# Patient Record
Sex: Female | Born: 1995 | Race: White | Hispanic: No | Marital: Single | Smoking: Never smoker
Health system: Southern US, Community
[De-identification: ages and names within clinical notes are randomized; demographics above are authoritative.]

## PROBLEM LIST (undated history)

## (undated) ENCOUNTER — Emergency Department: Discharge status: Absent without leave | Payer: Managed Care, Other (non HMO)

## (undated) DIAGNOSIS — I471 Supraventricular tachycardia: Secondary | ICD-10-CM

## (undated) DIAGNOSIS — G43909 Migraine, unspecified, not intractable, without status migrainosus: Secondary | ICD-10-CM

## (undated) HISTORY — PX: FASCIOTOMY: SHX132

## (undated) HISTORY — PX: ADENOIDECTOMY: SUR15

## (undated) HISTORY — PX: SHOULDER SURGERY: SHX246

---
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
## 2015-02-18 ENCOUNTER — Emergency Department
Admission: EM | Admit: 2015-02-18 | Discharge: 2015-02-19 | Disposition: A | Discharge status: Home / Self Care | Payer: BLUE CROSS/BLUE SHIELD | Attending: Emergency Medicine | Admitting: Emergency Medicine

## 2015-02-18 ENCOUNTER — Encounter: Payer: Self-pay | Admitting: Emergency Medicine

## 2015-02-18 ENCOUNTER — Emergency Department: Payer: BLUE CROSS/BLUE SHIELD

## 2015-02-18 DIAGNOSIS — Z8679 Personal history of other diseases of the circulatory system: Secondary | ICD-10-CM

## 2015-02-18 DIAGNOSIS — R000 Tachycardia, unspecified: Secondary | ICD-10-CM | POA: Diagnosis present

## 2015-02-18 DIAGNOSIS — I471 Supraventricular tachycardia: Secondary | ICD-10-CM | POA: Diagnosis not present

## 2015-02-18 LAB — CBC
HCT: 41.6 % (ref 35.0–47.0)
HEMOGLOBIN: 14.4 g/dL (ref 12.0–16.0)
MCH: 29.5 pg (ref 26.0–34.0)
MCHC: 34.6 g/dL (ref 32.0–36.0)
MCV: 85.3 fL (ref 80.0–100.0)
PLATELETS: 241 10*3/uL (ref 150–440)
RBC: 4.87 MIL/uL (ref 3.80–5.20)
RDW: 12.6 % (ref 11.5–14.5)
WBC: 4.7 10*3/uL (ref 3.6–11.0)

## 2015-02-18 LAB — BASIC METABOLIC PANEL
ANION GAP: 6 (ref 5–15)
BUN: 9 mg/dL (ref 6–20)
CALCIUM: 10.1 mg/dL (ref 8.9–10.3)
CO2: 29 mmol/L (ref 22–32)
CREATININE: 0.76 mg/dL (ref 0.44–1.00)
Chloride: 105 mmol/L (ref 101–111)
Glucose, Bld: 91 mg/dL (ref 65–99)
Potassium: 3.8 mmol/L (ref 3.5–5.1)
SODIUM: 140 mmol/L (ref 135–145)

## 2015-02-18 LAB — TSH: TSH: 2.35 u[IU]/mL (ref 0.350–4.500)

## 2015-02-18 LAB — TROPONIN I

## 2015-02-18 NOTE — ED Notes (Addendum)
Patient ambulatory to triage with steady gait, without difficulty or distress noted; pt reports sensation of heart beating fast PTA; st hx of same 3wks ago and received adenosine but denies hx prior to that; pt st sensation accomp by chest pressure

## 2015-02-18 NOTE — ED Notes (Signed)
Lab notified of add on TSH level
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
## 2015-02-22 ENCOUNTER — Encounter
Admission: EM | Disposition: A | Discharge status: Other Acute Inpatient Hospital | Payer: Self-pay | Source: Home / Self Care | Attending: Student

## 2015-02-22 ENCOUNTER — Emergency Department
Admission: EM | Admit: 2015-02-22 | Discharge: 2015-02-22 | Payer: BLUE CROSS/BLUE SHIELD | Attending: Student | Admitting: Student

## 2015-02-22 ENCOUNTER — Encounter: Payer: Self-pay | Admitting: *Deleted

## 2015-02-22 DIAGNOSIS — R000 Tachycardia, unspecified: Secondary | ICD-10-CM | POA: Diagnosis not present

## 2015-02-22 DIAGNOSIS — Z79899 Other long term (current) drug therapy: Secondary | ICD-10-CM | POA: Insufficient documentation

## 2015-02-22 DIAGNOSIS — M79662 Pain in left lower leg: Secondary | ICD-10-CM | POA: Insufficient documentation

## 2015-02-22 DIAGNOSIS — G8918 Other acute postprocedural pain: Secondary | ICD-10-CM | POA: Diagnosis not present

## 2015-02-22 DIAGNOSIS — M79605 Pain in left leg: Secondary | ICD-10-CM

## 2015-02-22 LAB — CBC WITH DIFFERENTIAL/PLATELET
BASOS ABS: 0 10*3/uL (ref 0–0.1)
Basophils Relative: 0 %
Eosinophils Absolute: 0 10*3/uL (ref 0–0.7)
Eosinophils Relative: 1 %
HEMATOCRIT: 39.9 % (ref 35.0–47.0)
Hemoglobin: 13.8 g/dL (ref 12.0–16.0)
LYMPHS PCT: 51 %
Lymphs Abs: 4.1 10*3/uL — ABNORMAL HIGH (ref 1.0–3.6)
MCH: 29.7 pg (ref 26.0–34.0)
MCHC: 34.6 g/dL (ref 32.0–36.0)
MCV: 86 fL (ref 80.0–100.0)
MONO ABS: 0.5 10*3/uL (ref 0.2–0.9)
MONOS PCT: 6 %
NEUTROS ABS: 3.4 10*3/uL (ref 1.4–6.5)
Neutrophils Relative %: 42 %
Platelets: 238 10*3/uL (ref 150–440)
RBC: 4.64 MIL/uL (ref 3.80–5.20)
RDW: 12.7 % (ref 11.5–14.5)
WBC: 8 10*3/uL (ref 3.6–11.0)

## 2015-02-22 LAB — PROTIME-INR
INR: 0.92
Prothrombin Time: 12.6 seconds (ref 11.4–15.0)

## 2015-02-22 LAB — BASIC METABOLIC PANEL
ANION GAP: 9 (ref 5–15)
BUN: 8 mg/dL (ref 6–20)
CALCIUM: 9.1 mg/dL (ref 8.9–10.3)
CO2: 24 mmol/L (ref 22–32)
Chloride: 106 mmol/L (ref 101–111)
Creatinine, Ser: 0.84 mg/dL (ref 0.44–1.00)
GFR calc Af Amer: >60 mL/min (ref >60)
GFR calc non Af Amer: >60 mL/min (ref >60)
GLUCOSE: 90 mg/dL (ref 65–99)
Potassium: 3.6 mmol/L (ref 3.5–5.1)
Sodium: 139 mmol/L (ref 135–145)

## 2015-02-22 LAB — APTT: APTT: 26 s (ref 24–36)

## 2015-02-22 SURGERY — FASCIOTOMY, UPPER EXTREMITY
Anesthesia: Choice | Laterality: Left

## 2015-02-22 MED ORDER — MORPHINE SULFATE (PF) 4 MG/ML IV SOLN
4.0000 mg | Freq: Once | INTRAVENOUS | Status: AC
  Administered 2015-02-22: 4 mg via INTRAVENOUS

## 2015-02-22 MED ORDER — MORPHINE SULFATE (PF) 4 MG/ML IV SOLN
4.0000 mg | Freq: Once | INTRAVENOUS | Status: AC
  Administered 2015-02-22: 4 mg via INTRAVENOUS
  Filled 2015-02-22: qty 1

## 2015-02-22 MED ORDER — LIDOCAINE HCL (PF) 1 % IJ SOLN
5.0000 mL | Freq: Once | INTRAMUSCULAR | Status: AC
  Administered 2015-02-22: 5 mL via INTRADERMAL

## 2015-02-22 MED ORDER — LIDOCAINE HCL (PF) 1 % IJ SOLN
INTRAMUSCULAR | Status: AC
  Administered 2015-02-22: 5 mL via INTRADERMAL
  Filled 2015-02-22: qty 5

## 2015-02-22 MED ORDER — ONDANSETRON HCL 4 MG/2ML IJ SOLN
4.0000 mg | Freq: Once | INTRAMUSCULAR | Status: AC
  Administered 2015-02-22: 4 mg via INTRAVENOUS

## 2015-02-22 MED ORDER — ONDANSETRON HCL 4 MG/2ML IJ SOLN
INTRAMUSCULAR | Status: AC
  Administered 2015-02-22: 4 mg via INTRAVENOUS
  Filled 2015-02-22: qty 2

## 2015-02-22 MED ORDER — ONDANSETRON HCL 4 MG/2ML IJ SOLN
4.0000 mg | Freq: Once | INTRAMUSCULAR | Status: AC
  Administered 2015-02-22: 4 mg via INTRAVENOUS
  Filled 2015-02-22: qty 2

## 2015-02-22 MED ORDER — MORPHINE SULFATE (PF) 4 MG/ML IV SOLN
INTRAVENOUS | Status: AC
  Administered 2015-02-22: 4 mg via INTRAVENOUS
  Filled 2015-02-22: qty 1

## 2015-02-22 NOTE — ED Provider Notes (Signed)
[facility] Emergency Department Provider Note  ____________________________________________  Time seen: Approximately 7:41 PM  I have reviewed the triage vital signs and the nursing notes.   HISTORY  Chief Complaint Leg Pain and Post-op Problem   HPI Deanna Little is a 19 y.o. female on postoperative day one status post fasciotomies of the left lower extremity for treatment of chronic compartment syndrome who presents for evaluation of gradual onset severe aching left leg pain today, currently severe, worse with movement and palpation. She was performed at [facility]. She has been taking her medications as prescribed but has had worsening severe pain. She called the orthopedic resident at [facility] who recommended she be seen at the nearest emergency department as soon as possible. Currently her pain is 10 out of 10. No chest pain, no difficulty breathing, no vomiting or diarrhea.    History reviewed. No pertinent past medical history.  There are no active problems to display for this patient.   Past Surgical History  Procedure Laterality Date  . Adenoidectomy    . Shoulder surgery      Current Outpatient Rx  Name  Route  Sig  Dispense  Refill  . amphetamine-dextroamphetamine (ADDERALL) 12.5 MG tablet   Oral   Take 12.5 mg by mouth daily.         . ondansetron (ZOFRAN-ODT) 4 MG disintegrating tablet   Oral   Take 4 mg by mouth every 8 (eight) hours as needed for nausea or vomiting.         . OxyCODONE HCl, Abuse Deter, (OXAYDO) 5 MG TABA   Oral   Take by mouth every 4 (four) hours.         . pregabalin (LYRICA) 75 MG capsule   Oral   Take 75 mg by mouth 2 (two) times daily.           Allergies Review of patient's allergies indicates no known allergies.  No family history on file.  Social History Social History  Substance Use Topics  . Smoking status: Never Smoker   . Smokeless tobacco: None  . Alcohol Use: No    Review of  Systems Constitutional: No fever/chills Eyes: No visual changes. ENT: No sore throat. Cardiovascular: Denies chest pain. Respiratory: Denies shortness of breath. Gastrointestinal: No abdominal pain.  No nausea, no vomiting.  No diarrhea.  No constipation. Genitourinary: Negative for dysuria. Musculoskeletal: Negative for back pain. Skin: Negative for rash. Neurological: Negative for headaches, focal weakness or numbness.  10-point ROS otherwise negative.  ____________________________________________   PHYSICAL EXAM:  VITAL SIGNS: ED Triage Vitals  Enc Vitals Group     BP 02/22/15 1936 132/88 mmHg     Pulse Rate 02/22/15 1936 100     Resp 02/22/15 1936 22     Temp --      Temp src --      SpO2 02/22/15 1936 100 %     Weight --      Height --      Head Cir --      Peak Flow --      Pain Score 02/22/15 1907 10     Pain Loc --      Pain Edu? --      Excl. in GC? --     Constitutional: Alert and oriented. In severe distress second to pain. Eyes: Conjunctivae are normal. PERRL. EOMI. Head: Atraumatic. Nose: No congestion/rhinnorhea. Mouth/Throat: Mucous membranes are moist.  Oropharynx non-erythematous. Neck: No stridor.   Cardiovascular:  mildly tachycardic rate, regular rhythm. Grossly normal heart sounds.  Good peripheral circulation. Respiratory: Normal respiratory effort.  No retractions. Lungs CTAB. Gastrointestinal: Soft and nontender. No distention. No abdominal bruits. No CVA tenderness. Genitourinary: deferred Musculoskeletal: There are 4 surgical incisions in the anterior portion of the left lower extremity which are covered with Steri-Strips, there is no surrounding erythema, induration, no purulent drainage. The patient does have tenderness throughout the left calf. She has a 2+ left DP pulse. She is able to wiggle the toes but only slightly and is complaining of decreased sensation in the dorsum of the foot. Neurologic:  Normal speech and language. No gross  focal neurologic deficits are appreciated. No gait instability. Skin:  Skin is warm, dry and intact. No rash noted. Psychiatric: Mood and affect are normal. Speech and behavior are normal.  ____________________________________________   LABS (all labs ordered are listed, but only abnormal results are displayed)  Labs Reviewed  CBC WITH DIFFERENTIAL/PLATELET - Abnormal; Notable for the following:    Lymphs Abs 4.1 (*)    All other components within normal limits  BASIC METABOLIC PANEL  PROTIME-INR  APTT   ____________________________________________  EKG  none ____________________________________________  RADIOLOGY  none ____________________________________________   PROCEDURES  Procedure(s) performed: None  Critical Care performed: No  ____________________________________________   INITIAL IMPRESSION / ASSESSMENT AND PLAN / ED COURSE  Pertinent labs & imaging results that were available during my care of the patient were reviewed by me and considered in my medical decision making (see chart for details).  Deanna Little is a 19 y.o. female on postoperative day one status post fasciotomies of the left lower extremity for treatment of chronic compartment syndrome who presents for evaluation of gradual onset severe aching left leg pain today. On exam, she is in distress secondary to pain. She does have tenderness throughout the left calf but hasn't 2+ DP pulse. She is able to wiggle the toes but only slightly. I discussed the case with the on-call surgeon at [facility], Dr.Garrigues, given that her operation was performed there. I have described her exam and he agrees there is concern for evolving symptoms of compartment syndrome, he has recommended the patient be seen emergently by our orthopedist. I discussed the case with Dr. Hyacinth Meeker of orthopedic surgery who will evaluate the patient. We'll treat her pain.  ----------------------------------------- 9:49 PM on  02/22/2015 -----------------------------------------  Dr. Hyacinth Meeker has evaluated the patient. He reports that on his assessment injured compartmental pressures are 17 - not consistent with acute compartment syndrome. Patient requested transfer to [facility]. We had an extensive conversation with family and this is their request and I discussed the case with Dr.Garrigues of [facility] who will accept transfer. Dr. Hyacinth Meeker has also had extensive conversations with the family as well as Dr. Joanette Gula and all are comfortable with the plan. ____________________________________________   FINAL CLINICAL IMPRESSION(S) / ED DIAGNOSES  Final diagnoses:  Leg pain, diffuse, left  Acute post-operative pain      Gayla Doss, MD 02/23/15 0023

## 2015-02-22 NOTE — Consult Note (Signed)
ORTHOPAEDIC CONSULTATION  REQUESTING PHYSICIAN: Gayla Doss, MD  Chief Complaint: Left leg pain  HPI: Deanna Little is a 19 y.o. female who complains of  increasing left leg pain today.  Yesterday Dr. Austin Miles at [facility], performed a limited incision 4 compartment decompression fasciotomy of the lower left leg for exertional compartment syndrome.  The patient had a nerve block performed as well.  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  She was prescribed oxycodone and Lyrica.  During the day she had the gradual increase of pain in the anterolateral aspect of the leg.  She's had some numbness on the top of the foot.  She also feels that she's had some weakness of the ankle and great toe.  Her mother and father are here with her for the weekend and called [facility] during the day but were not advised to bring her there at that time.  Because of increasing discomfort they presented to the [facility] emergency room.  I was called by Dr. Toney Rakes at about 8:30 PM and came to the emergency room directly.  Upon arrival, I found the patient to be in mild distress.  I removed the dressings on her leg and examine the leg carefully.  The tissues to be soft without evidence of hematoma or undue swelling.  She had some mild sensory deficit on the dorsum of the foot only.  She also had some weakness of the great toe and ankle at about 3-4 over 5.  Her dressings were clean and dry.  I explained to the patient and her parents that if she was indeed having an acute compartment syndrome episode that this was a surgical emergency and I recommended that we perform compartment pressure measurements.  Her parents felt very strongly that they wanted her daughter to be taken care of at [facility] since this was where she had her original surgery and requested that I contact the orthopedic surgeons at [facility] to discuss this  further with them.  I spoke to Dr. Iline Oven who spoke to Dr. Carolynne Edouard and then called back and again recommended that we proceed with compartment pressure measurements.  He spoke to the patient's mother directly and she agreed to this plan of treatment.  Using the [employer] compartment pressure measuring system the anterior compartment measured 17 mmHg pressure.  Dr. Iline Oven had agreed to take the patient in transfer if the pressures were below 30 mmHg.  We contacted Dr. Iline Oven again and he agreed to take the patient in transfer for observation and further evaluation.  Based on the pressure measurements, it did not appear that there was an acute compartment episode occurring.  The parents were at the bedside and informed of everything as it happened.  At the request arrangements were made for the patient to be transferred by EMS directly to [facility] emergency room for evaluation and admission.  History reviewed. No pertinent past medical history. Past Surgical History  Procedure Laterality Date  . Adenoidectomy    . Shoulder surgery     Social History   Social History  . Marital Status: Single    Spouse Name: N/A  . Number of Children: N/A  . Years of Education: N/A   Social History Main Topics  . Smoking status: Never Smoker   . Smokeless tobacco: None  . Alcohol Use: No  . Drug Use: None  . Sexual Activity:  Not Asked   Other Topics Concern  . None   Social History Narrative   No family history on file. No Known Allergies Prior to Admission medications   Medication Sig Start Date End Date Taking? Authorizing Provider  amphetamine-dextroamphetamine (ADDERALL) 12.5 MG tablet Take 12.5 mg by mouth daily.   Yes Historical Provider, MD  doxycycline (VIBRAMYCIN) 100 MG capsule Take 1 capsule by mouth 2 (two) times daily.   Yes Historical Provider, MD  drospirenone-ethinyl estradiol (LORYNA) 3-0.02 MG tablet Take 1 tablet by mouth daily. 11/28/14  Yes Historical Provider, MD  metoprolol  succinate (TOPROL-XL) 25 MG 24 hr tablet Take 1 tablet by mouth daily. 02/19/15 02/19/16 Yes Historical Provider, MD  ondansetron (ZOFRAN-ODT) 4 MG disintegrating tablet Take 4 mg by mouth every 8 (eight) hours as needed for nausea or vomiting.   Yes Historical Provider, MD  OxyCODONE HCl, Abuse Deter, (OXAYDO) 5 MG TABA Take by mouth every 4 (four) hours.   Yes Historical Provider, MD  pregabalin (LYRICA) 75 MG capsule Take 75 mg by mouth 2 (two) times daily.   Yes Historical Provider, MD   No results found.  Positive ROS: All other systems have been reviewed and were otherwise negative with the exception of those mentioned in the HPI and as above.  Physical Exam: General: Alert, no acute distress Cardiovascular: No pedal edema Respiratory: No cyanosis, no use of accessory musculature GI: No organomegaly, abdomen is soft and non-tender Skin: No lesions in the area of chief complaint Neurologic: Sensation intact distally except for left lower leg. Psychiatric: Patient is competent for consent with some distress and anxiety noted.   Lymphatic: No axillary or cervical lymphadenopathy  MUSCULOSKELETAL: The left lower leg was examined carefully with dressings removed.  Her dressings were clean and dry with no drainage.  There was no redness, warmth or evidence of cellulitis.  The anterior, lateral, and posterior compartments were soft to touch.  There was no ballottement.  She had some mild sensory deficit over the dorsum of the foot.  There was no sensory loss of the posterior calf or lateral aspect of the foot.  The  anterior tibialis and extensor hallucis longus muscles function exhibited weakness in the 3-4 over 5 category.  She had intact plantar flexion strength.  She had 2+ anterior and posterior tibial pulses.  The knee and upper leg were unremarkable to exam.  The right lower extremity was unremarkable to exam, as were the upper extremities.  Assessment: Left leg pain and numbness/weakness  following complete fasciotomies yesterday.  The tissues are soft and the compartment pressure testing is normal.  She has not been splinted, and the foot has been in a plantarflexed position all day.  This may have caused some of her problems.  Plan: At the parents and patient's request the patient is being transferred to [facility] orthopedic service for further evaluation.  I advised the patient and parents that further surgery may be indicated depending on their findings and any progression of symptoms.  They may call me with any questions or concerns and were given my card and phone numbers for future reference.    Valinda Hoar, MD [phone]   02/22/2015 10:11 PM

## 2015-02-22 NOTE — ED Notes (Signed)
OR RN Merry Proud reports OR ready for procedure. Page if necessary.

## 2015-02-22 NOTE — ED Notes (Signed)
MD Hyacinth Meeker at bedside, alongside OR RN Brandi.

## 2015-02-22 NOTE — ED Notes (Signed)
Pt reports surgery yesterday to relieve compartment syndrome. Pt has cast in place, but crying in pain. Surgery done at [facility]. Pt taking oxycodone, called surgeon, taking lyrica but still not helping.

## 2015-02-22 NOTE — ED Notes (Signed)
Surgeon at bedside.  

## 2015-02-22 NOTE — ED Notes (Signed)
Hand-off report given to Consulting civil engineer at [facility] ED.

## 2015-02-22 NOTE — ED Notes (Signed)
Hand-off report given to [employer].

## 2015-11-15 ENCOUNTER — Other Ambulatory Visit: Payer: Self-pay | Admitting: Family Medicine

## 2015-11-15 DIAGNOSIS — R1011 Right upper quadrant pain: Secondary | ICD-10-CM

## 2015-11-15 DIAGNOSIS — K582 Mixed irritable bowel syndrome: Secondary | ICD-10-CM

## 2015-11-19 ENCOUNTER — Ambulatory Visit
Admission: RE | Admit: 2015-11-19 | Discharge: 2015-11-19 | Disposition: A | Discharge status: Home / Self Care | Payer: BLUE CROSS/BLUE SHIELD | Source: Ambulatory Visit | Attending: Family Medicine | Admitting: Family Medicine

## 2015-11-19 DIAGNOSIS — R1011 Right upper quadrant pain: Secondary | ICD-10-CM | POA: Insufficient documentation

## 2015-11-19 DIAGNOSIS — K582 Mixed irritable bowel syndrome: Secondary | ICD-10-CM | POA: Insufficient documentation

## 2016-03-12 ENCOUNTER — Emergency Department
Admission: EM | Admit: 2016-03-12 | Discharge: 2016-03-12 | Disposition: A | Discharge status: Home / Self Care | Payer: No Typology Code available for payment source | Attending: Emergency Medicine | Admitting: Emergency Medicine

## 2016-03-12 ENCOUNTER — Encounter: Payer: Self-pay | Admitting: Emergency Medicine

## 2016-03-12 ENCOUNTER — Emergency Department: Payer: No Typology Code available for payment source

## 2016-03-12 DIAGNOSIS — Y92410 Unspecified street and highway as the place of occurrence of the external cause: Secondary | ICD-10-CM | POA: Insufficient documentation

## 2016-03-12 DIAGNOSIS — S161XXA Strain of muscle, fascia and tendon at neck level, initial encounter: Secondary | ICD-10-CM | POA: Insufficient documentation

## 2016-03-12 DIAGNOSIS — S199XXA Unspecified injury of neck, initial encounter: Secondary | ICD-10-CM | POA: Diagnosis present

## 2016-03-12 DIAGNOSIS — Z79899 Other long term (current) drug therapy: Secondary | ICD-10-CM | POA: Diagnosis not present

## 2016-03-12 DIAGNOSIS — Y9389 Activity, other specified: Secondary | ICD-10-CM | POA: Diagnosis not present

## 2016-03-12 DIAGNOSIS — M7918 Myalgia, other site: Secondary | ICD-10-CM

## 2016-03-12 DIAGNOSIS — Y999 Unspecified external cause status: Secondary | ICD-10-CM | POA: Insufficient documentation

## 2016-03-12 MED ORDER — CYCLOBENZAPRINE HCL 10 MG PO TABS: 10.0000 mg | ORAL_TABLET | Freq: Three times a day (TID) | ORAL | 0 refills | Status: AC | PRN

## 2016-03-12 MED ORDER — NAPROXEN 500 MG PO TABS
500.0000 mg | ORAL_TABLET | Freq: Once | ORAL | Status: AC
  Administered 2016-03-12: 500 mg via ORAL
  Filled 2016-03-12: qty 1

## 2016-03-12 MED ORDER — CYCLOBENZAPRINE HCL 10 MG PO TABS
10.0000 mg | ORAL_TABLET | Freq: Once | ORAL | Status: AC
  Administered 2016-03-12: 10 mg via ORAL
  Filled 2016-03-12: qty 1

## 2016-03-12 MED ORDER — NAPROXEN 500 MG PO TABS: 500.0000 mg | ORAL_TABLET | Freq: Two times a day (BID) | ORAL | 0 refills | Status: AC
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
## 2016-04-07 ENCOUNTER — Emergency Department
Admission: EM | Admit: 2016-04-07 | Discharge: 2016-04-07 | Disposition: A | Discharge status: Home / Self Care | Payer: Managed Care, Other (non HMO) | Attending: Emergency Medicine | Admitting: Emergency Medicine

## 2016-04-07 ENCOUNTER — Encounter: Payer: Self-pay | Admitting: Emergency Medicine

## 2016-04-07 DIAGNOSIS — G43901 Migraine, unspecified, not intractable, with status migrainosus: Secondary | ICD-10-CM | POA: Insufficient documentation

## 2016-04-07 DIAGNOSIS — G43909 Migraine, unspecified, not intractable, without status migrainosus: Secondary | ICD-10-CM | POA: Diagnosis present

## 2016-04-07 HISTORY — DX: Supraventricular tachycardia: I47.1

## 2016-04-07 HISTORY — DX: Migraine, unspecified, not intractable, without status migrainosus: G43.909

## 2016-04-07 MED ORDER — KETOROLAC TROMETHAMINE 30 MG/ML IJ SOLN
INTRAMUSCULAR | Status: AC
  Filled 2016-04-07: qty 1

## 2016-04-07 MED ORDER — METOCLOPRAMIDE HCL 10 MG PO TABS
10.0000 mg | ORAL_TABLET | Freq: Once | ORAL | Status: AC
  Administered 2016-04-07: 10 mg via ORAL

## 2016-04-07 MED ORDER — PREDNISONE 20 MG PO TABS: 40.0000 mg | ORAL_TABLET | Freq: Every day | ORAL | 0 refills | Status: AC

## 2016-04-07 MED ORDER — METOCLOPRAMIDE HCL 10 MG PO TABS
ORAL_TABLET | ORAL | Status: AC
  Filled 2016-04-07: qty 1

## 2016-04-07 MED ORDER — DIPHENHYDRAMINE HCL 25 MG PO CAPS
ORAL_CAPSULE | ORAL | Status: AC
  Filled 2016-04-07: qty 1

## 2016-04-07 MED ORDER — KETOROLAC TROMETHAMINE 60 MG/2ML IM SOLN
15.0000 mg | Freq: Once | INTRAMUSCULAR | Status: AC
  Administered 2016-04-07: 15 mg via INTRAMUSCULAR

## 2016-04-07 MED ORDER — PREDNISONE 20 MG PO TABS
40.0000 mg | ORAL_TABLET | ORAL | Status: AC
  Administered 2016-04-07: 40 mg via ORAL

## 2016-04-07 MED ORDER — DIPHENHYDRAMINE HCL 25 MG PO CAPS: 50.0000 mg | ORAL_CAPSULE | Freq: Four times a day (QID) | ORAL | 0 refills | Status: AC | PRN

## 2016-04-07 MED ORDER — PREDNISONE 20 MG PO TABS
ORAL_TABLET | ORAL | Status: AC
  Filled 2016-04-07: qty 2

## 2016-04-07 MED ORDER — DIPHENHYDRAMINE HCL 25 MG PO CAPS
25.0000 mg | ORAL_CAPSULE | Freq: Once | ORAL | Status: AC
  Administered 2016-04-07: 25 mg via ORAL

## 2016-04-07 MED ORDER — METOCLOPRAMIDE HCL 10 MG PO TABS: 10.0000 mg | ORAL_TABLET | Freq: Four times a day (QID) | ORAL | 0 refills | Status: AC | PRN

## 2016-04-07 NOTE — ED Provider Notes (Signed)
[facility] Emergency Department Provider Note  ____________________________________________  Time seen: Approximately 7:45 PM  I have reviewed the triage vital signs and the nursing notes.   HISTORY  Chief Complaint Migraine    HPI Deanna Little is a 20 y.o. female who complains of left-sided headache over the retro-orbital and parietal area associated with lacrimation on the left side. No vision changes numbness tingling or weakness. No neck pain or stiffness. No fevers or chills. Nausea but no vomiting. This is been going onsince yesterday and not relieved with Excedrin. No other complaints. She does feel like her left eyelid is slightly weaker and heavier. Not on the right. No history of myasthenia. No recent viral syndrome. No rash or tick bites. No outdoor activities. She has a history of migraines and although this is similar she feels the intensity is different than usual.     Past Medical History:  Diagnosis Date  . Migraines   . SVT (supraventricular tachycardia) (HCC)      There are no active problems to display for this patient.    Past Surgical History:  Procedure Laterality Date  . ADENOIDECTOMY    . FASCIOTOMY    . SHOULDER SURGERY       Prior to Admission medications   Medication Sig Start Date End Date Taking? Authorizing Provider  amphetamine-dextroamphetamine (ADDERALL) 12.5 MG tablet Take 12.5 mg by mouth daily.    Historical Provider, MD  cyclobenzaprine (FLEXERIL) 10 MG tablet Take 1 tablet (10 mg total) by mouth 3 (three) times daily as needed for muscle spasms. 03/12/16   Chinita Pesterari B Triplett, FNP  diphenhydrAMINE (BENADRYL) 25 mg capsule Take 2 capsules (50 mg total) by mouth every 6 (six) hours as needed. 04/07/16   Sharman CheekPhillip Annaelle Kasel, MD  doxycycline (VIBRAMYCIN) 100 MG capsule Take 1 capsule by mouth 2 (two) times daily.    Historical Provider, MD  drospirenone-ethinyl estradiol (LORYNA) 3-0.02 MG tablet Take 1 tablet by  mouth daily. 11/28/14   Historical Provider, MD  metoCLOPramide (REGLAN) 10 MG tablet Take 1 tablet (10 mg total) by mouth every 6 (six) hours as needed. 04/07/16   Sharman CheekPhillip Lanson Randle, MD  metoprolol succinate (TOPROL-XL) 25 MG 24 hr tablet Take 1 tablet by mouth daily. 02/19/15 02/19/16  Historical Provider, MD  naproxen (NAPROSYN) 500 MG tablet Take 1 tablet (500 mg total) by mouth 2 (two) times daily with a meal. 03/12/16   Cari B Triplett, FNP  ondansetron (ZOFRAN-ODT) 4 MG disintegrating tablet Take 4 mg by mouth every 8 (eight) hours as needed for nausea or vomiting.    Historical Provider, MD  OxyCODONE HCl, Abuse Deter, (OXAYDO) 5 MG TABA Take by mouth every 4 (four) hours.    Historical Provider, MD  predniSONE (DELTASONE) 20 MG tablet Take 2 tablets (40 mg total) by mouth daily. 04/07/16   Sharman CheekPhillip Marquest Gunkel, MD  pregabalin (LYRICA) 75 MG capsule Take 75 mg by mouth 2 (two) times daily.    Historical Provider, MD     Allergies Patient has no known allergies.   No family history on file.  Social History Social History  Substance Use Topics  . Smoking status: Never Smoker  . Smokeless tobacco: Never Used  . Alcohol use Yes     Comment: once per week    Review of Systems  Constitutional:   No fever or chills.  ENT:   No sore throat. No rhinorrhea. Cardiovascular:   No chest pain. Respiratory:   No dyspnea or cough. Neurological:  Positive as above for headache 10-point ROS otherwise negative.  ____________________________________________   PHYSICAL EXAM:  VITAL SIGNS: ED Triage Vitals  Enc Vitals Group     BP 04/07/16 1650 (!) 143/89     Pulse Rate 04/07/16 1650 (!) 102     Resp 04/07/16 1650 18     Temp 04/07/16 1650 99.1 F (37.3 C)     Temp Source 04/07/16 1650 Oral     SpO2 04/07/16 1650 100 %     Weight 04/07/16 1650 134 lb (60.8 kg)     Height 04/07/16 1650 5\' 4"  (1.626 m)     Head Circumference --      Peak Flow --      Pain Score 04/07/16 1651 6     Pain  Loc --      Pain Edu? --      Excl. in GC? --     Vital signs reviewed, nursing assessments reviewed.   Constitutional:   Alert and oriented. Well appearing and in no distress. Eyes:   No scleral icterus. No conjunctival pallor. PERRL. EOMI.  No nystagmus. ENT   Head:   Normocephalic and atraumatic.   Nose:   No congestion/rhinnorhea. No septal hematoma   Mouth/Throat:   MMM, no pharyngeal erythema. No peritonsillar mass.    Neck:   No stridor. No SubQ emphysema. No meningismus. Hematological/Lymphatic/Immunilogical:   No cervical lymphadenopathy. Cardiovascular:   RRR. Symmetric bilateral radial and DP pulses.  No murmurs.  Respiratory:   Normal respiratory effort without tachypnea nor retractions. Breath sounds are clear and equal bilaterally. No wheezes/rales/rhonchi. Gastrointestinal:   Soft and nontender. Non distended. There is no CVA tenderness.  No rebound, rigidity, or guarding. Genitourinary:   deferred Musculoskeletal:   Nontender with normal range of motion in all extremities. No joint effusions.  No lower extremity tenderness.  No edema. Neurologic:   Normal speech and language.  CN 2-10 normal. No pronator drift Normal finger-nose-finger. Motor grossly intact. No gross focal neurologic deficits are appreciated.  Skin:    Skin is warm, dry and intact. No rash noted.  No petechiae, purpura, or bullae.  ____________________________________________    LABS (pertinent positives/negatives) (all labs ordered are listed, but only abnormal results are displayed) Labs Reviewed - No data to display ____________________________________________   EKG    ____________________________________________    RADIOLOGY    ____________________________________________   PROCEDURES Procedures  ____________________________________________   INITIAL IMPRESSION / ASSESSMENT AND PLAN / ED COURSE  Pertinent labs & imaging results that were available during my  care of the patient were reviewed by me and considered in my medical decision making (see chart for details).  Patient presents with headache, consistent with uncomplicated migraine. Consistent with establish headache pattern.Considering the patient's symptoms, medical history, and physical examination today, I have low suspicion for ischemic stroke, intracranial hemorrhage, meningitis, encephalitis, carotid or vertebral dissection, venous sinus thrombosis, MS, intracranial hypertension, glaucoma, CRAO, CRVO, or temporal arteritis. We'll treat with migraine cocktail of prednisone and Benadryl Reglan and Toradol. Discharge home with symptomatic control as well. Follow up with primary care. Patient denies the possibility of pregnancy and is having regular periods. She is compliant with oral contraceptives..     Clinical Course    ____________________________________________   FINAL CLINICAL IMPRESSION(S) / ED DIAGNOSES  Final diagnoses:  Migraine with status migrainosus, not intractable, unspecified migraine type       Portions of this note were generated with dragon dictation software. Dictation errors may occur despite best  attempts at proofreading.    Sharman Cheek, MD 04/07/16 [phone]

## 2016-04-07 NOTE — ED Notes (Signed)
Pt reports history of migraines and has had one since yesterday with left eye lid feeling heavy, light sensitivity and nausea.
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
## 2016-09-26 ENCOUNTER — Encounter: Payer: Self-pay | Admitting: Emergency Medicine

## 2016-09-26 ENCOUNTER — Emergency Department: Payer: Managed Care, Other (non HMO)

## 2016-09-26 ENCOUNTER — Emergency Department
Admission: EM | Admit: 2016-09-26 | Discharge: 2016-09-26 | Disposition: A | Discharge status: Home / Self Care | Payer: Managed Care, Other (non HMO) | Attending: Emergency Medicine | Admitting: Emergency Medicine

## 2016-09-26 DIAGNOSIS — S0990XA Unspecified injury of head, initial encounter: Secondary | ICD-10-CM

## 2016-09-26 DIAGNOSIS — Y929 Unspecified place or not applicable: Secondary | ICD-10-CM | POA: Insufficient documentation

## 2016-09-26 DIAGNOSIS — Y9389 Activity, other specified: Secondary | ICD-10-CM | POA: Insufficient documentation

## 2016-09-26 DIAGNOSIS — S0083XA Contusion of other part of head, initial encounter: Secondary | ICD-10-CM

## 2016-09-26 DIAGNOSIS — S022XXA Fracture of nasal bones, initial encounter for closed fracture: Secondary | ICD-10-CM | POA: Diagnosis not present

## 2016-09-26 DIAGNOSIS — Y999 Unspecified external cause status: Secondary | ICD-10-CM | POA: Insufficient documentation

## 2016-09-26 DIAGNOSIS — W500XXA Accidental hit or strike by another person, initial encounter: Secondary | ICD-10-CM | POA: Insufficient documentation

## 2016-09-26 MED ORDER — OXYCODONE-ACETAMINOPHEN 5-325 MG PO TABS
2.0000 | ORAL_TABLET | Freq: Once | ORAL | Status: AC
  Administered 2016-09-26: 2 via ORAL
  Filled 2016-09-26: qty 2

## 2016-09-26 MED ORDER — DOCUSATE SODIUM 100 MG PO CAPS: ORAL_CAPSULE | ORAL | 0 refills | Status: AC

## 2016-09-26 MED ORDER — MORPHINE SULFATE (PF) 4 MG/ML IV SOLN
4.0000 mg | Freq: Once | INTRAVENOUS | Status: AC
  Administered 2016-09-26: 4 mg via INTRAMUSCULAR
  Filled 2016-09-26: qty 1

## 2016-09-26 MED ORDER — OXYCODONE-ACETAMINOPHEN 5-325 MG PO TABS: 1.0000 | ORAL_TABLET | ORAL | 0 refills | Status: AC | PRN

## 2016-09-26 MED ORDER — ONDANSETRON 4 MG PO TBDP: ORAL_TABLET | ORAL | 0 refills | Status: AC
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
## 2016-09-26 NOTE — ED Notes (Signed)
Pt resting more comfortably, reports pain has improved.
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
## 2016-09-26 NOTE — Discharge Instructions (Addendum)
As we discussed, your workup was notable for one or more nasal bone fractures.  These injuries are typically managed nonoperatively.  Please follow up with the physician listed or one of his/her colleagues in about a week (call at the next available opportunity to set up the appointment).  In the meantime, please use over-the-counter ibuprofen according to label instructions.  Do not blow your nose.  If you need to sneeze, try to keep your mouth open to decrease the pressure.  Take Percocet as prescribed for severe pain. Do not drink alcohol, drive or participate in any other potentially dangerous activities while taking this medication as it may make you sleepy. Do not take this medication with any other sedating medications, either prescription or over-the-counter. If you were prescribed Percocet or Vicodin, do not take these with acetaminophen (Tylenol) as it is already contained within these medications.   This medication is an opiate (or narcotic) pain medication and can be habit forming.  Use it as little as possible to achieve adequate pain control.  Do not use or use it with extreme caution if you have a history of opiate abuse or dependence.  If you are on a pain contract with your primary care doctor or a pain specialist, be sure to let them know you were prescribed this medication today from the [facility].  This medication is intended for your use only - do not give any to anyone else and keep it in a secure place where nobody else, especially children, have access to it.  It will also cause or worsen constipation, so you may want to consider taking an over-the-counter stool softener while you are taking this medication.  Return to the emergency department if you develop new or worsening symptoms that concern you.

## 2016-09-26 NOTE — ED Notes (Signed)
Pt given ice pack and bandages. Has ride home
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
## 2022-08-14 ENCOUNTER — Other Ambulatory Visit (HOSPITAL_COMMUNITY): Payer: Self-pay
